# Patient Record
Sex: Male | Born: 1960 | Race: White | Hispanic: No | Marital: Married | Smoking: Current every day smoker
Health system: Southern US, Community
[De-identification: ages and names within clinical notes are randomized; demographics above are authoritative.]

---

## 2009-06-13 HISTORY — PX: SHOULDER SURGERY: SHX246

## 2018-05-17 ENCOUNTER — Other Ambulatory Visit: Payer: Self-pay

## 2018-05-17 ENCOUNTER — Emergency Department (HOSPITAL_COMMUNITY): Payer: Worker's Compensation | Admitting: Registered Nurse

## 2018-05-17 ENCOUNTER — Emergency Department (HOSPITAL_COMMUNITY): Payer: Worker's Compensation

## 2018-05-17 ENCOUNTER — Encounter (HOSPITAL_COMMUNITY): Payer: Self-pay

## 2018-05-17 ENCOUNTER — Ambulatory Visit (HOSPITAL_COMMUNITY)
Admission: EM | Admit: 2018-05-17 | Discharge: 2018-05-17 | Disposition: A | Discharge status: Home / Self Care | Payer: Worker's Compensation | Attending: Orthopedic Surgery | Admitting: Orthopedic Surgery

## 2018-05-17 ENCOUNTER — Encounter (HOSPITAL_COMMUNITY)
Admission: EM | Disposition: A | Discharge status: Home / Self Care | Payer: Self-pay | Source: Home / Self Care | Attending: Emergency Medicine

## 2018-05-17 DIAGNOSIS — S62660B Nondisplaced fracture of distal phalanx of right index finger, initial encounter for open fracture: Secondary | ICD-10-CM | POA: Diagnosis present

## 2018-05-17 DIAGNOSIS — Z23 Encounter for immunization: Secondary | ICD-10-CM | POA: Insufficient documentation

## 2018-05-17 DIAGNOSIS — F17200 Nicotine dependence, unspecified, uncomplicated: Secondary | ICD-10-CM | POA: Diagnosis not present

## 2018-05-17 DIAGNOSIS — Z791 Long term (current) use of non-steroidal anti-inflammatories (NSAID): Secondary | ICD-10-CM | POA: Insufficient documentation

## 2018-05-17 DIAGNOSIS — S68110A Complete traumatic metacarpophalangeal amputation of right index finger, initial encounter: Secondary | ICD-10-CM | POA: Diagnosis not present

## 2018-05-17 DIAGNOSIS — Z79899 Other long term (current) drug therapy: Secondary | ICD-10-CM | POA: Insufficient documentation

## 2018-05-17 DIAGNOSIS — W230XXA Caught, crushed, jammed, or pinched between moving objects, initial encounter: Secondary | ICD-10-CM | POA: Insufficient documentation

## 2018-05-17 HISTORY — PX: AMPUTATION: SHX166

## 2018-05-17 LAB — CBC WITH DIFFERENTIAL/PLATELET
Abs Immature Granulocytes: 0.02 10*3/uL (ref 0.00–0.07)
Basophils Absolute: 0 10*3/uL (ref 0.0–0.1)
Basophils Relative: 1 %
Eosinophils Absolute: 0.2 10*3/uL (ref 0.0–0.5)
Eosinophils Relative: 3 %
HCT: 41.4 % (ref 39.0–52.0)
Hemoglobin: 13.7 g/dL (ref 13.0–17.0)
Immature Granulocytes: 0 %
Lymphocytes Relative: 23 %
Lymphs Abs: 1.4 10*3/uL (ref 0.7–4.0)
MCH: 31.1 pg (ref 26.0–34.0)
MCHC: 33.1 g/dL (ref 30.0–36.0)
MCV: 94.1 fL (ref 80.0–100.0)
Monocytes Absolute: 0.4 10*3/uL (ref 0.1–1.0)
Monocytes Relative: 7 %
Neutro Abs: 3.9 10*3/uL (ref 1.7–7.7)
Neutrophils Relative %: 66 %
PLATELETS: 178 10*3/uL (ref 150–400)
RBC: 4.4 MIL/uL (ref 4.22–5.81)
RDW: 13.3 % (ref 11.5–15.5)
WBC: 6 10*3/uL (ref 4.0–10.5)
nRBC: 0 % (ref 0.0–0.2)

## 2018-05-17 LAB — BASIC METABOLIC PANEL
Anion gap: 8 (ref 5–15)
BUN: 25 mg/dL — AB (ref 6–20)
CO2: 26 mmol/L (ref 22–32)
Calcium: 8.9 mg/dL (ref 8.9–10.3)
Chloride: 109 mmol/L (ref 98–111)
Creatinine, Ser: 0.74 mg/dL (ref 0.61–1.24)
GFR calc Af Amer: >60 mL/min (ref >60)
Glucose, Bld: 112 mg/dL — ABNORMAL HIGH (ref 70–99)
POTASSIUM: 3.5 mmol/L (ref 3.5–5.1)
Sodium: 143 mmol/L (ref 135–145)

## 2018-05-17 SURGERY — AMPUTATION DIGIT
Anesthesia: General | Site: Finger | Laterality: Right

## 2018-05-17 MED ORDER — MORPHINE SULFATE (PF) 4 MG/ML IV SOLN
4.0000 mg | Freq: Once | INTRAVENOUS | Status: AC
  Administered 2018-05-17: 4 mg via INTRAVENOUS
  Filled 2018-05-17: qty 1

## 2018-05-17 MED ORDER — MEPERIDINE HCL 50 MG/ML IJ SOLN: 6.2500 mg | INTRAMUSCULAR | Status: DC | PRN

## 2018-05-17 MED ORDER — BUPIVACAINE HCL (PF) 0.5 % IJ SOLN
20.0000 mL | Freq: Once | INTRAMUSCULAR | Status: AC
  Administered 2018-05-17: 20 mL
  Filled 2018-05-17: qty 30

## 2018-05-17 MED ORDER — HYDROCODONE-ACETAMINOPHEN 5-325 MG PO TABS: ORAL_TABLET | ORAL | 0 refills | Status: AC

## 2018-05-17 MED ORDER — 0.9 % SODIUM CHLORIDE (POUR BTL) OPTIME
TOPICAL | Status: DC | PRN
  Administered 2018-05-17: 1000 mL

## 2018-05-17 MED ORDER — BUPIVACAINE HCL (PF) 0.25 % IJ SOLN
INTRAMUSCULAR | Status: AC
  Filled 2018-05-17: qty 30

## 2018-05-17 MED ORDER — HYDROMORPHONE HCL 1 MG/ML IJ SOLN: 0.2500 mg | INTRAMUSCULAR | Status: DC | PRN

## 2018-05-17 MED ORDER — ONDANSETRON HCL 4 MG/2ML IJ SOLN
INTRAMUSCULAR | Status: DC | PRN
  Administered 2018-05-17: 4 mg via INTRAVENOUS

## 2018-05-17 MED ORDER — BUPIVACAINE HCL 0.25 % IJ SOLN
INTRAMUSCULAR | Status: DC | PRN
  Administered 2018-05-17: 10 mL

## 2018-05-17 MED ORDER — FENTANYL CITRATE (PF) 250 MCG/5ML IJ SOLN
INTRAMUSCULAR | Status: AC
  Filled 2018-05-17: qty 5

## 2018-05-17 MED ORDER — DEXAMETHASONE SODIUM PHOSPHATE 10 MG/ML IJ SOLN
INTRAMUSCULAR | Status: DC | PRN
  Administered 2018-05-17: 10 mg via INTRAVENOUS

## 2018-05-17 MED ORDER — LACTATED RINGERS IV SOLN
INTRAVENOUS | Status: DC | PRN
  Administered 2018-05-17: 22:00:00 via INTRAVENOUS

## 2018-05-17 MED ORDER — MIDAZOLAM HCL 2 MG/2ML IJ SOLN
INTRAMUSCULAR | Status: AC
  Filled 2018-05-17: qty 2

## 2018-05-17 MED ORDER — TETANUS-DIPHTH-ACELL PERTUSSIS 5-2.5-18.5 LF-MCG/0.5 IM SUSP
0.5000 mL | Freq: Once | INTRAMUSCULAR | Status: AC
  Administered 2018-05-17: 0.5 mL via INTRAMUSCULAR
  Filled 2018-05-17: qty 0.5

## 2018-05-17 MED ORDER — LIDOCAINE HCL (PF) 1 % IJ SOLN
20.0000 mL | Freq: Once | INTRAMUSCULAR | Status: AC
  Administered 2018-05-17: 20 mL
  Filled 2018-05-17: qty 30

## 2018-05-17 MED ORDER — EPHEDRINE SULFATE-NACL 50-0.9 MG/10ML-% IV SOSY
PREFILLED_SYRINGE | INTRAVENOUS | Status: DC | PRN
  Administered 2018-05-17: 10 mg via INTRAVENOUS
  Administered 2018-05-17 (×2): 5 mg via INTRAVENOUS

## 2018-05-17 MED ORDER — SUCCINYLCHOLINE CHLORIDE 200 MG/10ML IV SOSY
PREFILLED_SYRINGE | INTRAVENOUS | Status: DC | PRN
  Administered 2018-05-17: 120 mg via INTRAVENOUS

## 2018-05-17 MED ORDER — MIDAZOLAM HCL 5 MG/5ML IJ SOLN
INTRAMUSCULAR | Status: DC | PRN
  Administered 2018-05-17: 2 mg via INTRAVENOUS

## 2018-05-17 MED ORDER — LACTATED RINGERS IV SOLN: INTRAVENOUS | Status: DC

## 2018-05-17 MED ORDER — CEFAZOLIN SODIUM-DEXTROSE 1-4 GM/50ML-% IV SOLN: 1.0000 g | Freq: Once | INTRAVENOUS | Status: DC

## 2018-05-17 MED ORDER — CEFAZOLIN SODIUM-DEXTROSE 2-4 GM/100ML-% IV SOLN
2.0000 g | Freq: Once | INTRAVENOUS | Status: AC
  Administered 2018-05-17: 2 g via INTRAVENOUS
  Filled 2018-05-17: qty 100

## 2018-05-17 MED ORDER — PROMETHAZINE HCL 25 MG/ML IJ SOLN: 6.2500 mg | INTRAMUSCULAR | Status: DC | PRN

## 2018-05-17 MED ORDER — LIDOCAINE 2% (20 MG/ML) 5 ML SYRINGE
INTRAMUSCULAR | Status: DC | PRN
  Administered 2018-05-17: 50 mg via INTRAVENOUS

## 2018-05-17 MED ORDER — SULFAMETHOXAZOLE-TRIMETHOPRIM 800-160 MG PO TABS: 1.0000 | ORAL_TABLET | Freq: Two times a day (BID) | ORAL | 0 refills | Status: AC

## 2018-05-17 MED ORDER — OXYCODONE-ACETAMINOPHEN 5-325 MG PO TABS
1.0000 | ORAL_TABLET | Freq: Once | ORAL | Status: DC
  Filled 2018-05-17: qty 1

## 2018-05-17 MED ORDER — GLYCOPYRROLATE 0.2 MG/ML IJ SOLN
INTRAMUSCULAR | Status: DC | PRN
  Administered 2018-05-17: 0.2 mg via INTRAVENOUS

## 2018-05-17 MED ORDER — PROPOFOL 10 MG/ML IV BOLUS
INTRAVENOUS | Status: DC | PRN
  Administered 2018-05-17: 200 mg via INTRAVENOUS

## 2018-05-17 SURGICAL SUPPLY — 37 items
BLADE LONG MED 31X9 (MISCELLANEOUS) ×2 IMPLANT
BNDG COHESIVE 1X5 TAN STRL LF (GAUZE/BANDAGES/DRESSINGS) ×2 IMPLANT
BNDG ESMARK 4X9 LF (GAUZE/BANDAGES/DRESSINGS) ×2 IMPLANT
BNDG GAUZE ELAST 4 BULKY (GAUZE/BANDAGES/DRESSINGS) IMPLANT
CORDS BIPOLAR (ELECTRODE) ×2 IMPLANT
COVER WAND RF STERILE (DRAPES) ×2 IMPLANT
CUFF TOURNIQUET SINGLE 18IN (TOURNIQUET CUFF) ×2 IMPLANT
CUFF TOURNIQUET SINGLE 24IN (TOURNIQUET CUFF) IMPLANT
DRAPE SURG 17X23 STRL (DRAPES) ×2 IMPLANT
DURAPREP 26ML APPLICATOR (WOUND CARE) IMPLANT
GAUZE SPONGE 4X4 12PLY STRL (GAUZE/BANDAGES/DRESSINGS) ×2 IMPLANT
GAUZE XEROFORM 1X8 LF (GAUZE/BANDAGES/DRESSINGS) ×2 IMPLANT
GLOVE BIO SURGEON STRL SZ7.5 (GLOVE) ×2 IMPLANT
GLOVE BIOGEL PI IND STRL 8 (GLOVE) ×1 IMPLANT
GLOVE BIOGEL PI INDICATOR 8 (GLOVE) ×1
GOWN STRL REUS W/ TWL LRG LVL3 (GOWN DISPOSABLE) ×1 IMPLANT
GOWN STRL REUS W/ TWL XL LVL3 (GOWN DISPOSABLE) ×1 IMPLANT
GOWN STRL REUS W/TWL LRG LVL3 (GOWN DISPOSABLE) ×1
GOWN STRL REUS W/TWL XL LVL3 (GOWN DISPOSABLE) ×1
KIT BASIN OR (CUSTOM PROCEDURE TRAY) ×2 IMPLANT
KIT TURNOVER KIT B (KITS) ×2 IMPLANT
NEEDLE HYPO 25GX1X1/2 BEV (NEEDLE) IMPLANT
NS IRRIG 1000ML POUR BTL (IV SOLUTION) ×2 IMPLANT
PACK ORTHO EXTREMITY (CUSTOM PROCEDURE TRAY) ×2 IMPLANT
PAD ARMBOARD 7.5X6 YLW CONV (MISCELLANEOUS) ×4 IMPLANT
PAD CAST 4YDX4 CTTN HI CHSV (CAST SUPPLIES) IMPLANT
PADDING CAST COTTON 4X4 STRL (CAST SUPPLIES)
SCRUB POVIDONE IODINE 4 OZ (MISCELLANEOUS) ×2 IMPLANT
SOL PREP POV-IOD 4OZ 10% (MISCELLANEOUS) ×2 IMPLANT
SPECIMEN JAR SMALL (MISCELLANEOUS) ×2 IMPLANT
SUT CHROMIC 6 0 PS 4 (SUTURE) ×2 IMPLANT
SUT MON AB 5-0 P3 18 (SUTURE) ×2 IMPLANT
SUT MON AB 5-0 PS2 18 (SUTURE) IMPLANT
SUT VICRYL 4-0 PS2 18IN ABS (SUTURE) IMPLANT
SYR CONTROL 10ML LL (SYRINGE) IMPLANT
TOWEL OR 17X26 10 PK STRL BLUE (TOWEL DISPOSABLE) ×2 IMPLANT
UNDERPAD 30X30 (UNDERPADS AND DIAPERS) ×2 IMPLANT

## 2018-05-17 NOTE — Transfer of Care (Signed)
Immediate Anesthesia Transfer of Care Note  Patient: [name]  Procedure(s) Performed: REVISION AMPUTATION RIGHT INDEX FINGER (Right Finger)  Patient Location: PACU  Anesthesia Type:General  Level of Consciousness: awake, alert , oriented and patient cooperative  Airway & Oxygen Therapy: Patient Spontanous Breathing  Post-op Assessment: Report given to RN and Post -op Vital signs reviewed and stable  Post vital signs: Reviewed and stable  Last Vitals:  Vitals Value Taken Time  BP    Temp    Pulse 95 05/17/2018 10:45 PM  Resp    SpO2 97 % 05/17/2018 10:45 PM  Vitals shown include unvalidated device data.  Last Pain:  Vitals:   05/17/18 1920  TempSrc:   PainSc: 5          Complications: No apparent anesthesia complications

## 2018-05-17 NOTE — H&P (Signed)
Brandon Conway is an 57 y.o. male.   Chief Complaint: right index finger amputation HPI: 57 yo rhd male states right index finger amputated at work today on metal press.  Reports no previous injury to right hand and no other injury at this time.  He describes burning pain.  Alleviated with elevation and aggravated by palpation.  There is associated bleeding.  Has had digital block from ED.  [name] note from 05/17/2018 reviewed. Xrays viewed and interpreted by me: ap, lateral, oblique views right index finger show dorsal oblique amputation distal aspect of finger.  Exposed distal phalanx. Labs reviewed: none  Allergies: No Known Allergies  History reviewed. No pertinent past medical history.  Past Surgical History:  Procedure Laterality Date  . SHOULDER SURGERY Right 2011    Family History: No family history on file.  Social History:   reports that he has been smoking. He has been smoking about 0.75 packs per day. He has never used smokeless tobacco. He reports that he does not drink alcohol or use drugs.  Medications:  (Not in a hospital admission)  Results for orders placed or performed during the hospital encounter of 05/17/18 (from the past 48 hour(s))  CBC with Differential     Status: None   Collection Time: 05/17/18  4:29 PM  Result Value Ref Range   WBC 6.0 4.0 - 10.5 K/uL   RBC 4.40 4.22 - 5.81 MIL/uL   Hemoglobin 13.7 13.0 - 17.0 g/dL   HCT 16.1 09.6 - 04.5 %   MCV 94.1 80.0 - 100.0 fL   MCH 31.1 26.0 - 34.0 pg   MCHC 33.1 30.0 - 36.0 g/dL   RDW 40.9 81.1 - 91.4 %   Platelets 178 150 - 400 K/uL   nRBC 0.0 0.0 - 0.2 %   Neutrophils Relative % 66 %   Neutro Abs 3.9 1.7 - 7.7 K/uL   Lymphocytes Relative 23 %   Lymphs Abs 1.4 0.7 - 4.0 K/uL   Monocytes Relative 7 %   Monocytes Absolute 0.4 0.1 - 1.0 K/uL   Eosinophils Relative 3 %   Eosinophils Absolute 0.2 0.0 - 0.5 K/uL   Basophils Relative 1 %   Basophils Absolute 0.0 0.0 - 0.1 K/uL   Immature  Granulocytes 0 %   Abs Immature Granulocytes 0.02 0.00 - 0.07 K/uL    Comment: Performed at [facility], [address], [place], [place] [address]  Basic metabolic panel     Status: Abnormal   Collection Time: 05/17/18  4:29 PM  Result Value Ref Range   Sodium 143 135 - 145 mmol/L   Potassium 3.5 3.5 - 5.1 mmol/L   Chloride 109 98 - 111 mmol/L   CO2 26 22 - 32 mmol/L   Glucose, Bld 112 (H) 70 - 99 mg/dL   BUN 25 (H) 6 - 20 mg/dL   Creatinine, Ser 6.21 0.61 - 1.24 mg/dL   Calcium 8.9 8.9 - 30.8 mg/dL   GFR calc non Af Amer >60 >60 mL/min   GFR calc Af Amer >60 >60 mL/min   Anion gap 8 5 - 15    Comment: Performed at [facility], [address], [place], [place] [address]    Dg Finger Index Right  Result Date: 05/17/2018 CLINICAL DATA:  57 year old male with a history of crush injury EXAM: RIGHT INDEX FINGER 2+V COMPARISON:  None. FINDINGS: Soft tissue defect of the distal second digit of the right hand, with absence of  the soft tissues overlying the distal phalanx and the lateral view demonstrating nondisplaced tuft fracture. No subluxation/dislocation. No radiopaque foreign body. IMPRESSION: Soft tissue injury of the second digit right hand with evidence of nondisplaced fracture of the tuft of the distal phalanx, compatible with open fracture. Electronically Signed   By: Gilmer MorJaime  Wagner D.O.   On: 05/17/2018 15:51     A comprehensive review of systems was negative. Review of Systems: No fevers, chills, night sweats, chest pain, shortness of breath, nausea, vomiting, diarrhea, constipation, easy bleeding or bruising, headaches, dizziness, vision changes, fainting.   Blood pressure (!) 178/97, pulse 63, temperature (!) 97.5 F (36.4 C), resp. rate 16, height 5\' 4"  (1.626 m), weight 78 kg, SpO2 99 %.  General appearance: alert, cooperative and appears stated age Head: Normocephalic, without obvious abnormality, atraumatic Neck: supple,  symmetrical, trachea midline Resp: clear to auscultation bilaterally Cardio: regular rate and rhythm Extremities: Intact sensation and capillary refill all digits.  +epl/fpl/io.  Right index with amputation though nail bed in oblique fashion. Pulses: 2+ and symmetric Skin: Skin color, texture, turgor normal. No rashes or lesions Neurologic: Grossly normal Incision/Wound: as above  Assessment/Plan Right index finger amputation.  Recommend revision amputation in OR.  Risks, benefits and alternatives of surgery were discussed including risks of blood loss, infection, damage to nerves/vessels/tendons/ligament/bone, failure of surgery, need for additional surgery, complication with wound healing, nonunion, malunion, stiffness.  He voiced understanding of these risks and elected to proceed.    Brandon Conway 05/17/2018, 9:30 PM

## 2018-05-17 NOTE — Discharge Instructions (Addendum)

## 2018-05-17 NOTE — ED Provider Notes (Signed)
[facility]-EMERGENCY DEPT Provider Note   CSN: [id number] Arrival date & time: 05/17/18  1448     History   Chief Complaint Chief Complaint  Patient presents with  . Hand Injury    HPI Brandon Conway is a 57 y.o. male.  HPI Patient presents to the emergency department with a finger injury that occurred while at work.  The patient was using a hydraulic shear machine when his finger went under the guard and got cut by the blade.  Patient states that he applied direct plaster to control the bleeding.  Patient states that nothing seems make the condition better or worse.  The patient states that he has a burning type pain at the end of the finger.  Patient denies any other injuries. History reviewed. No pertinent past medical history.  There are no active problems to display for this patient.   Past Surgical History:  Procedure Laterality Date  . SHOULDER SURGERY Right 2011        Home Medications    Prior to Admission medications   Medication Sig Start Date End Date Taking? Authorizing Provider  amLODipine (NORVASC) 5 MG tablet Take 5 mg by mouth daily.  05/17/18  Yes [provider]  ibuprofen (ADVIL,MOTRIN) 200 MG tablet Take 800 mg by mouth daily as needed for moderate pain.   Yes [provider]    Family History No family history on file.  Social History Social History   Tobacco Use  . Smoking status: Current Every Day Smoker    Packs/day: 0.75  . Smokeless tobacco: Never Used  Substance Use Topics  . Alcohol use: Never    Frequency: Never  . Drug use: Never     Allergies   Patient has no known allergies.   Review of Systems Review of Systems   All other systems negative except as documented in the HPI. All pertinent positives and negatives as reviewed in the HPI. Dog physical Exam Updated Vital Signs BP (!) 159/116 (BP Location: Left Arm)   Pulse 84   Temp 98.6 F (37 C) (Oral)   Resp 16   Ht 5\' 4"   (1.626 m)   Wt 78 kg   SpO2 99%   BMI 29.52 kg/m   Physical Exam  Constitutional: He is oriented to person, place, and time. He appears well-developed and well-nourished. No distress.  HENT:  Head: Normocephalic and atraumatic.  Eyes: Pupils are equal, round, and reactive to light.  Pulmonary/Chest: Effort normal.  Musculoskeletal:       Hands: Neurological: He is alert and oriented to person, place, and time.  Skin: Skin is warm and dry.  Psychiatric: He has a normal mood and affect.  Nursing note and vitals reviewed.    ED Treatments / Results  Labs (all labs ordered are listed, but only abnormal results are displayed) Labs Reviewed  BASIC METABOLIC PANEL - Abnormal; Notable for the following components:      Result Value   Glucose, Bld 112 (*)    BUN 25 (*)    All other components within normal limits  CBC WITH DIFFERENTIAL/PLATELET    EKG None  Radiology Dg Finger Index Right  Result Date: 05/17/2018 CLINICAL DATA:  57 year old male with a history of crush injury EXAM: RIGHT INDEX FINGER 2+V COMPARISON:  None. FINDINGS: Soft tissue defect of the distal second digit of the right hand, with absence of the soft tissues overlying the distal phalanx and the lateral view demonstrating nondisplaced tuft fracture.  No subluxation/dislocation. No radiopaque foreign body. IMPRESSION: Soft tissue injury of the second digit right hand with evidence of nondisplaced fracture of the tuft of the distal phalanx, compatible with open fracture. Electronically Signed   By: Gilmer MorJaime  Wagner D.O.   On: 05/17/2018 15:51    Procedures Procedures (including critical care time)  Medications Ordered in ED Medications  oxyCODONE-acetaminophen (PERCOCET/ROXICET) 5-325 MG per tablet 1 tablet (1 tablet Oral Not Given 05/17/18 1611)  ceFAZolin (ANCEF) IVPB 2g/100 mL premix (2 g Intravenous New Bag/Given 05/17/18 1626)  Tdap (BOOSTRIX) injection 0.5 mL (0.5 mLs Intramuscular Given 05/17/18 1612)    lidocaine (PF) (XYLOCAINE) 1 % injection 20 mL (20 mLs Infiltration Given 05/17/18 1612)  bupivacaine (MARCAINE) 0.5 % injection 20 mL (20 mLs Infiltration Given 05/17/18 1706)     Initial Impression / Assessment and Plan / ED Course  I have reviewed the triage vital signs and the nursing notes.  Pertinent labs & imaging results that were available during my care of the patient were reviewed by me and considered in my medical decision making (see chart for details).  I spoke with Dr. Merrilee SeashoreKuzma's nurse who spoke with him and would like the patient to be sent over to [facility] for further evaluation by him in the emergency room.  Patient is given a digital block and a wet dressing was applied.  Patient's tetanus was updated.  Patient advised to go straight to the hospital and remain n.p.o.  Patient agrees to all the plans and all questions were answered.  Final Clinical Impressions(s) / ED Diagnoses   Final diagnoses:  None    ED Discharge Orders    None       Charlestine NightLawyer, Jedaiah Rathbun, PA-C 05/17/18 1731    Arby BarrettePfeiffer, Marcy, MD 05/17/18 2156

## 2018-05-17 NOTE — Anesthesia Preprocedure Evaluation (Addendum)
Anesthesia Evaluation  Patient identified by MRN, date of birth, ID band Patient awake    Reviewed: Allergy & Precautions, NPO status , Patient's Chart, lab work & pertinent test results  Airway Mallampati: II  TM Distance: >3 FB Neck ROM: Full    Dental  (+) Edentulous Upper, Dental Advisory Given   Pulmonary Current Smoker,    breath sounds clear to auscultation       Cardiovascular negative cardio ROS   Rhythm:Regular Rate:Normal     Neuro/Psych negative neurological ROS  negative psych ROS   GI/Hepatic negative GI ROS, Neg liver ROS,   Endo/Other  negative endocrine ROS  Renal/GU negative Renal ROS     Musculoskeletal negative musculoskeletal ROS (+)   Abdominal Normal abdominal exam  (+)   Peds  Hematology negative hematology ROS (+)   Anesthesia Other Findings   Reproductive/Obstetrics                            Anesthesia Physical Anesthesia Plan  ASA: II  Anesthesia Plan: General   Post-op Pain Management:    Induction: Intravenous  PONV Risk Score and Plan: 2 and Ondansetron and Midazolam  Airway Management Planned: Oral ETT  Additional Equipment: None  Intra-op Plan:   Post-operative Plan: Extubation in OR  Informed Consent: I have reviewed the patients History and Physical, chart, labs and discussed the procedure including the risks, benefits and alternatives for the proposed anesthesia with the patient or authorized representative who has indicated his/her understanding and acceptance.   Dental advisory given  Plan Discussed with: CRNA  Anesthesia Plan Comments:        Anesthesia Quick Evaluation

## 2018-05-17 NOTE — ED Notes (Signed)
Surgery at bedside.

## 2018-05-17 NOTE — Anesthesia Procedure Notes (Signed)
Procedure Name: Intubation Date/Time: 05/17/2018 10:03 PM Performed by: Oletta Lamas, CRNA Pre-anesthesia Checklist: Patient identified, Emergency Drugs available, Suction available and Patient being monitored Patient Re-evaluated:Patient Re-evaluated prior to induction Oxygen Delivery Method: Circle System Utilized Preoxygenation: Pre-oxygenation with 100% oxygen Induction Type: IV induction Ventilation: Mask ventilation without difficulty Laryngoscope Size: Mac and 4 Grade View: Grade I Tube type: Oral Tube size: 7.5 mm Number of attempts: 1 Airway Equipment and Method: Stylet and Oral airway Placement Confirmation: ETT inserted through vocal cords under direct vision,  positive ETCO2 and breath sounds checked- equal and bilateral Secured at: 23 cm Tube secured with: Tape Dental Injury: Teeth and Oropharynx as per pre-operative assessment

## 2018-05-17 NOTE — ED Notes (Signed)
Patient AVS given and transfer information given to patient. Patient verbalized understanding.

## 2018-05-17 NOTE — Anesthesia Postprocedure Evaluation (Signed)
Anesthesia Post Note  Patient: Brandon Conway  Procedure(s) Performed: REVISION AMPUTATION RIGHT INDEX FINGER (Right Finger)     Patient location during evaluation: PACU Anesthesia Type: General Level of consciousness: awake and alert Pain management: pain level controlled Vital Signs Assessment: post-procedure vital signs reviewed and stable Respiratory status: spontaneous breathing, nonlabored ventilation, respiratory function stable and patient connected to nasal cannula oxygen Cardiovascular status: blood pressure returned to baseline and stable Postop Assessment: no apparent nausea or vomiting Anesthetic complications: no    Last Vitals:  Vitals:   05/17/18 2300 05/17/18 2315  BP: (!) 138/100 (!) 145/101  Pulse: 86 74  Resp: 20 14  Temp:  36.5 C  SpO2: 99% 98%    Last Pain:  Vitals:   05/17/18 2315  TempSrc:   PainSc: 0-No pain                 Shelton SilvasKevin D Hollis

## 2018-05-17 NOTE — ED Provider Notes (Signed)
Patient transferred from [facility] to see hand surgeon, Dr. Merlyn LotKuzma See previous provider's note for full H&P  Briefly, patient was cutting stainless steel and the saw came down and hit his right index finger.  He sustained open fracture. There is exposed bone.  Wound was dressed with wet dressing.  He is right-handed.  Tetanus updated at [facility].  Patient given digital block which is now wearing off.  Dr. Merlyn LotKuzma to evaluate the patient and determine disposition.  Labs unremarkable.  Patient evaluated by Dr. Merlyn LotKuzma who will take the patient to the OR for revision of amputation.  Patient's pain controlled in the ED with morphine.   Emi HolesLaw, Windle Huebert M, PA-C 05/17/18 2121    Mancel BaleWentz, Elliott, MD 05/18/18 [phone]

## 2018-05-17 NOTE — ED Provider Notes (Signed)
MSE was initiated and I personally evaluated the patient and placed orders (if any) at  3:20 PM on May 17, 2018.  Prior to arrival, patient was working with a hydraulic shear when his finger got sheared off and then crushed.  This was his right index finger.  Patient reports pain at the distal finger.  He does not know when his last tetanus shot was.  He is not on blood thinners.  He takes medicine for high blood pressure, no other medical problems.  He has not taken anything for pain so far today.   Physical exam: See picture below.  Patient with partial amputation of the right index finger with what appears to be exposed bone.        The patient appears stable so that the remainder of the MSE may be completed by another provider.   Alveria ApleyCaccavale, Layna Roeper, PA-C 05/17/18 1522    Wynetta FinesMessick, Peter C, MD 05/18/18 [phone]

## 2018-05-17 NOTE — ED Triage Notes (Signed)
Pt states he was working with a Acupuncturiststainless steel saw and got his finger caught on the grip. Pt injured tip of right index finger. Piece missing to first joint.

## 2018-05-17 NOTE — Op Note (Signed)
NAME: Brandon Conway MEDICAL RECORD NO: [id number] DATE OF BIRTH: 03/16/1961 F[facility] LOCATION: [facility] OR PHYSICIAN: Tami RibasKEVIN R. Charlet Harr, MD   OPERATIVE REPORT   DATE OF PROCEDURE: 05/17/18    PREOPERATIVE DIAGNOSIS:   Right index finger amputation   POSTOPERATIVE DIAGNOSIS:   Right index finger amputation   PROCEDURE:   Right index finger revision amputation   SURGEON:  Betha LoaKevin Adeoluwa Silvers, M.D.   ASSISTANT: none   ANESTHESIA:  General   INTRAVENOUS FLUIDS:  Per anesthesia flow sheet.   ESTIMATED BLOOD LOSS:  Minimal.   COMPLICATIONS:  None.   SPECIMENS:  none   TOURNIQUET TIME:   Right arm: 17 minutes at 250 mmHg   DISPOSITION:  Stable to PACU.   INDICATIONS: 57 year old right-hand-dominant male states he amputated the tip of the right index finger at work today.  He was seen in the emergency department.  I recommended revision amputation in the operating room. Risks, benefits and alternatives of surgery were discussed including the risks of blood loss, infection, damage to nerves, vessels, tendons, ligaments, bone for surgery, need for additional surgery, complications with wound healing, continued pain, nonunion, malunion, stiffness.  He voiced understanding of these risks and elected to proceed.  OPERATIVE COURSE:  After being identified preoperatively by myself,  the patient and I agreed on the procedure and site of the procedure.  The surgical site was marked.  Surgical consent had been signed. He was given IV Ancef in the emergency department. He was transferred to the operating room and placed on the operating table in supine position with the Right upper extremity on an arm board.  General anesthesia was induced by the anesthesiologist.  Right upper extremity was prepped and draped in normal sterile orthopedic fashion.  A surgical pause was performed between the surgeons, anesthesia, and operating room staff and all were in agreement as to the patient, procedure, and site of  procedure.  Tourniquet at the proximal aspect of the extremity was inflated to 250 mmHg after exsanguination of the arm with an Esmarch bandage.    A digital block was performed with 10 cc of quarter percent plain Marcaine to aid in postoperative analgesia.  The wound was explored.  There was no gross contamination.  There is loss of the distal half of the nailbed.  The tuft was exposed.  The wound was copiously irrigated with sterile saline.  The soft tissues were mobilized.  The distal aspect of the tuft was removed with rongeurs.  This was done until the soft tissues could cover over the end of the bone.  6-0 chromic suture was then used in interrupted fashion to reapproximate soft tissues.  There was a longitudinal laceration of the nailbed which was also repaired.  There is good contour of the finger completion of the procedure.  A piece of Xeroform was placed in the nail fold and the wound dressed with sterile Xeroform 4 x 4 and wrapped with Coban dressing lightly.  An AlumaFoam splint was placed and wrapped lightly with Coban dressing.  The tourniquet was deflated at 17 minutes.  Fingertips were pink with brisk capillary refill after deflation of tourniquet.  The operative  drapes were broken down.  The patient was awoken from anesthesia safely.  He was transferred back to the stretcher and taken to PACU in stable condition.  I will see him back in the office in 1 week for postoperative followup.  I will give him a prescription for Norco 5/325 1-2 tabs PO q6  hours prn pain, dispense # 20 and Bactrim DS 1 p.o. twice daily x7 days.   Betha Loa, MD Electronically signed, 05/17/18

## 2018-05-18 ENCOUNTER — Encounter (HOSPITAL_COMMUNITY): Payer: Self-pay | Admitting: Orthopedic Surgery

## 2019-05-10 IMAGING — DX DG FINGER INDEX 2+V*R*
3 series · 3 of 3 positions shown · non-contrast
Comparison: None.

CLINICAL DATA: 57-year-old male with a history of crush injury

EXAM:
RIGHT INDEX FINGER 2+V

[finger ap]
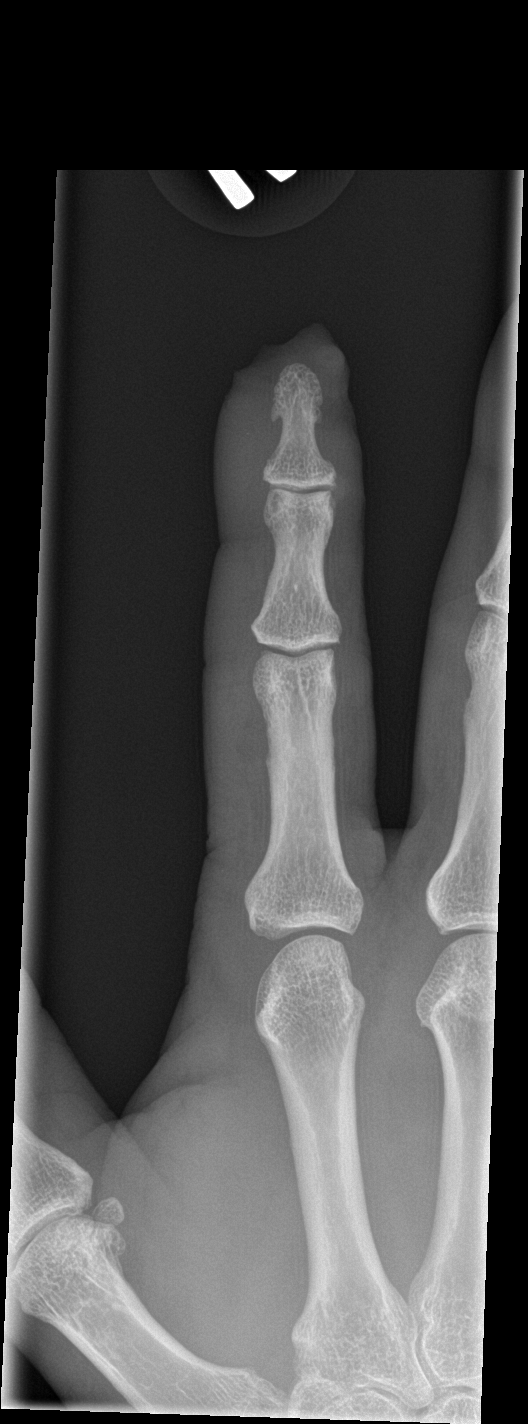

[finger obl]
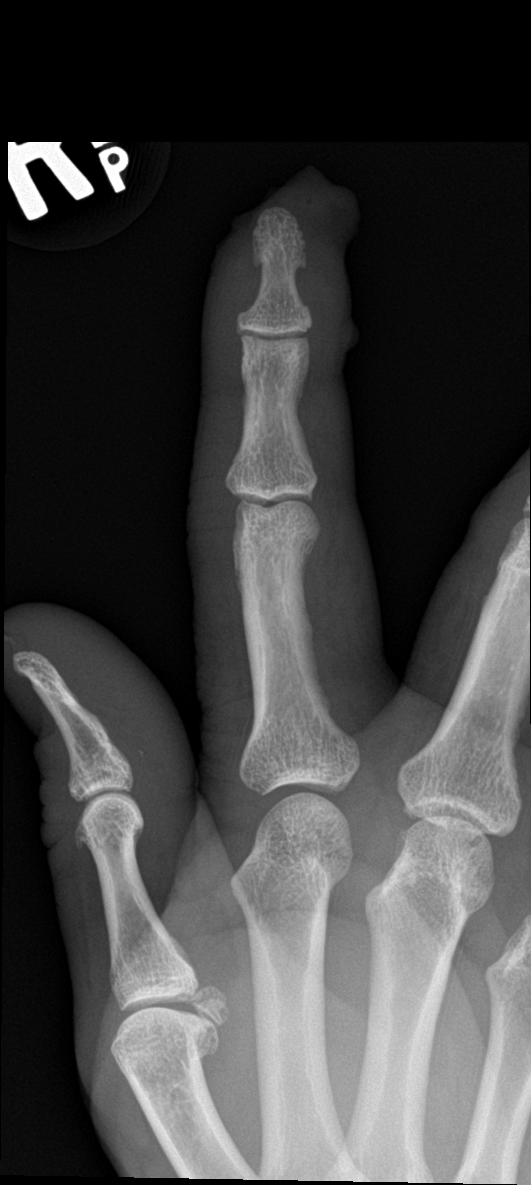

[finger lat]
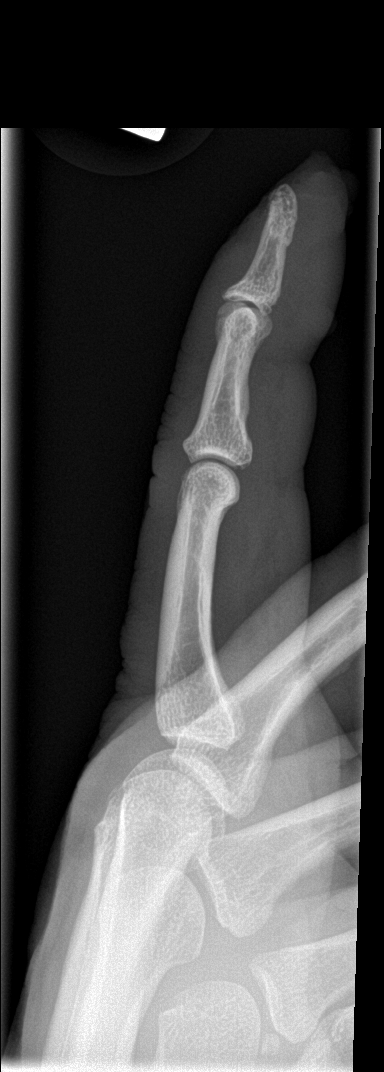

[3 of 3 positions shown; findings below may reference images not displayed]

FINDINGS: Soft tissue defect of the distal second digit of the right hand,
with absence of the soft tissues overlying the distal phalanx and
the lateral view demonstrating nondisplaced tuft fracture.

No subluxation/dislocation.

No radiopaque foreign body.
IMPRESSION: Soft tissue injury of the second digit right hand with evidence of
nondisplaced fracture of the tuft of the distal phalanx, compatible
with open fracture.
